# Patient Record
Sex: Female | Born: 1990 | Race: Asian | Hispanic: No | Marital: Single | Smoking: Never smoker
Health system: Southern US, Community
[De-identification: ages and names within clinical notes are randomized; demographics above are authoritative.]

---

## 2012-12-06 ENCOUNTER — Encounter (HOSPITAL_COMMUNITY): Payer: Self-pay | Admitting: Emergency Medicine

## 2012-12-06 ENCOUNTER — Other Ambulatory Visit (HOSPITAL_COMMUNITY)
Admission: RE | Admit: 2012-12-06 | Discharge: 2012-12-06 | Disposition: A | Discharge status: Home / Self Care | Payer: Self-pay | Source: Ambulatory Visit | Attending: Family Medicine | Admitting: Family Medicine

## 2012-12-06 ENCOUNTER — Emergency Department (INDEPENDENT_AMBULATORY_CARE_PROVIDER_SITE_OTHER)
Admission: EM | Admit: 2012-12-06 | Discharge: 2012-12-06 | Disposition: A | Discharge status: Home / Self Care | Payer: Self-pay | Source: Home / Self Care | Attending: Family Medicine | Admitting: Family Medicine

## 2012-12-06 DIAGNOSIS — N760 Acute vaginitis: Secondary | ICD-10-CM | POA: Insufficient documentation

## 2012-12-06 DIAGNOSIS — Z113 Encounter for screening for infections with a predominantly sexual mode of transmission: Secondary | ICD-10-CM | POA: Insufficient documentation

## 2012-12-06 DIAGNOSIS — R1031 Right lower quadrant pain: Secondary | ICD-10-CM

## 2012-12-06 LAB — POCT PREGNANCY, URINE: Preg Test, Ur: NEGATIVE

## 2012-12-06 MED ORDER — PROMETHAZINE HCL 25 MG PO TABS: 25.0000 mg | ORAL_TABLET | Freq: Three times a day (TID) | ORAL | Status: DC | PRN

## 2012-12-06 MED ORDER — NAPROXEN 500 MG PO TABS: 500.0000 mg | ORAL_TABLET | Freq: Two times a day (BID) | ORAL | Status: DC

## 2012-12-06 MED ORDER — NORGESTIM-ETH ESTRAD TRIPHASIC 0.18/0.215/0.25 MG-25 MCG PO TABS: 1.0000 | ORAL_TABLET | Freq: Every day | ORAL | Status: DC

## 2012-12-06 NOTE — ED Notes (Signed)
Patient instructed to undress, place on gown for exam.  Pelvic equipment at bedside

## 2012-12-06 NOTE — ED Notes (Signed)
[phone] is phone number given by patient for contacting her

## 2012-12-06 NOTE — ED Provider Notes (Signed)
History     CSN: [id number]  Arrival date & time 12/06/12  1113   First MD Initiated Contact with Patient 12/06/12 1133      Chief Complaint  Patient presents with  . Abdominal Pain    (Consider location/radiation/quality/duration/timing/severity/associated sxs/prior treatment) HPI Comments: 22 year old nonsmoker female G0 P0. Here complaining of right lower quadrant pain intermittent for about one month. Patient states the pain is not severe but mild to moderate. Has not intermittently for about one month. Denies associated burning on urination or hematuria. No back pain. Denies vaginal discharge. She is just at the end of her menstrual period currently. Denies irregular menstrual periods. No changes on her bowel movements. Denies constipation. She does have loose stools intermittently sporadically. Denies pain with intercourse. No fever or chills. She has had intermittent nausea but no vomiting. No rash. Patient has been sexually active for one year and does not use contraception and admits to inconsistent condom use. She has had a steady partner for one year. No prior history of sexually transmitted diseases. No personal family history of ovarian cancer.   History reviewed. No pertinent past medical history.  History reviewed. No pertinent past surgical history.  No family history on file.  History  Substance Use Topics  . Smoking status: Never Smoker   . Smokeless tobacco: Not on file  . Alcohol Use: Yes    OB History   Grav Para Term Preterm Abortions TAB SAB Ect Mult Living                  Review of Systems  Constitutional: Negative for fever, chills, diaphoresis, appetite change and fatigue.  Gastrointestinal: Positive for abdominal pain. Negative for vomiting and constipation.  Genitourinary: Negative for dysuria, frequency, hematuria, flank pain, vaginal bleeding, vaginal discharge, menstrual problem and dyspareunia.  Skin: Negative for rash.  Neurological: Negative  for dizziness and headaches.  All other systems reviewed and are negative.    Allergies  Sulfa antibiotics  Home Medications   Current Outpatient Rx  Name  Route  Sig  Dispense  Refill  . naproxen (NAPROSYN) 500 MG tablet   Oral   Take 1 tablet (500 mg total) by mouth 2 (two) times daily.   30 tablet   0   . Norgestimate-Ethinyl Estradiol Triphasic 0.18/0.215/0.25 MG-25 MCG tab   Oral   Take 1 tablet by mouth daily.   1 Package   3   . promethazine (PHENERGAN) 25 MG tablet   Oral   Take 1 tablet (25 mg total) by mouth every 8 (eight) hours as needed for nausea.   15 tablet   0     BP 115/74  Pulse 78  Temp(Src) 98.1 F (36.7 C) (Oral)  Resp 18  SpO2 100%  LMP 11/29/2012  Physical Exam  Nursing note and vitals reviewed. Constitutional: She is oriented to person, place, and time. She appears well-developed and well-nourished. No distress.  HENT:  Head: Normocephalic and atraumatic.  Eyes: No scleral icterus.  Cardiovascular: Normal heart sounds.   Pulmonary/Chest: Breath sounds normal.  Abdominal: Soft. Bowel sounds are normal. She exhibits no distension and no mass. There is no tenderness. There is no rebound and no guarding. Hernia confirmed negative in the right inguinal area and confirmed negative in the left inguinal area.  Right lower quadrant tenderness reported with deep palpation.  Genitourinary: Uterus normal.    There is no rash on the right labia. There is no rash on the left labia. Cervix exhibits  no motion tenderness, no discharge and no friability. Right adnexum displays tenderness and fullness. Right adnexum displays no mass. Left adnexum displays no mass, no tenderness and no fullness. Vaginal discharge found.  Bimanual exam: No tenderness to cervical motion. No tender uterus impress normal size. Impress right adnexa palpable and mildly tender. No palpable mass.  Lymphadenopathy:    She has no cervical adenopathy.       Right: No inguinal  adenopathy present.       Left: No inguinal adenopathy present.  Neurological: She is alert and oriented to person, place, and time.  Skin: No rash noted. She is not diaphoretic.    ED Course  Procedures (including critical care time)  Labs Reviewed  POCT PREGNANCY, URINE  CERVICOVAGINAL ANCILLARY ONLY  CERVICOVAGINAL ANCILLARY ONLY   No results found.   1. RLQ abdominal pain       MDM  22 year old female G0 P0 here complaining of right lower quadrant pain intermittently for about a month. Negative pregnancy test. Impress right ovary palpable. Otherwise normal pelvic/speculum exam with no tenderness to cervical motion. Impress possible ovarian functional cyst versus other ovarian pathology. Prescribed naproxen, promethazine and gave a prescription for birth control pills as per patient request. Affirm, GC and Chlamydia test pending at the time of discharge. Patient has an order for a palpation pelvic ultrasound. Supportive care and red flags that should prompt her return to medical attention discussed with patient and provided in writing.        Sharin Grave, MD 12/06/12 1309

## 2012-12-06 NOTE — ED Notes (Signed)
Reports lower right abdomen swelling, intermittent pain ( not excrutiating, just ache) denies urinary symptoms, denies abnormal vaginal discharge, reports bm's have been like usual, but they are formed sometimes and diarrhea sometimes and refers to this as normal for her.  Noticed puffy, aching area for one week

## 2012-12-08 LAB — POCT URINALYSIS DIP (DEVICE)
Ketones, ur: NEGATIVE mg/dL
Protein, ur: NEGATIVE mg/dL
Specific Gravity, Urine: 1.015 (ref 1.005–1.030)

## 2012-12-16 ENCOUNTER — Ambulatory Visit (HOSPITAL_COMMUNITY)
Admission: RE | Admit: 2012-12-16 | Discharge: 2012-12-16 | Disposition: A | Discharge status: Home / Self Care | Payer: Self-pay | Source: Ambulatory Visit | Attending: Family Medicine | Admitting: Family Medicine

## 2012-12-16 ENCOUNTER — Other Ambulatory Visit (HOSPITAL_COMMUNITY): Payer: Self-pay | Admitting: Family Medicine

## 2012-12-16 DIAGNOSIS — R1900 Intra-abdominal and pelvic swelling, mass and lump, unspecified site: Secondary | ICD-10-CM | POA: Insufficient documentation

## 2012-12-16 DIAGNOSIS — N83209 Unspecified ovarian cyst, unspecified side: Secondary | ICD-10-CM | POA: Insufficient documentation

## 2012-12-16 DIAGNOSIS — R1031 Right lower quadrant pain: Secondary | ICD-10-CM

## 2012-12-16 DIAGNOSIS — N949 Unspecified condition associated with female genital organs and menstrual cycle: Secondary | ICD-10-CM | POA: Insufficient documentation

## 2012-12-20 NOTE — ED Notes (Signed)
PT  CALLED   INQUIRING  ULTRASOUND  RESULTS      -  NOTIFIED  NORMAL   -  PT  WAS  ADVISED  TO  FOLLOWUP AS  DIRECTED  ON HER LAST  VISIT

## 2012-12-30 ENCOUNTER — Encounter (HOSPITAL_COMMUNITY): Payer: Self-pay | Admitting: Advanced Practice Midwife

## 2012-12-30 ENCOUNTER — Inpatient Hospital Stay (HOSPITAL_COMMUNITY)
Admission: AD | Admit: 2012-12-30 | Discharge: 2012-12-30 | Disposition: A | Discharge status: Home / Self Care | Payer: Self-pay | Source: Ambulatory Visit | Attending: Obstetrics & Gynecology | Admitting: Obstetrics & Gynecology

## 2012-12-30 DIAGNOSIS — R143 Flatulence: Secondary | ICD-10-CM

## 2012-12-30 DIAGNOSIS — R109 Unspecified abdominal pain: Secondary | ICD-10-CM | POA: Insufficient documentation

## 2012-12-30 DIAGNOSIS — R140 Abdominal distension (gaseous): Secondary | ICD-10-CM

## 2012-12-30 DIAGNOSIS — R141 Gas pain: Secondary | ICD-10-CM | POA: Insufficient documentation

## 2012-12-30 DIAGNOSIS — N83209 Unspecified ovarian cyst, unspecified side: Secondary | ICD-10-CM | POA: Insufficient documentation

## 2012-12-30 DIAGNOSIS — R142 Eructation: Secondary | ICD-10-CM | POA: Insufficient documentation

## 2012-12-30 DIAGNOSIS — N949 Unspecified condition associated with female genital organs and menstrual cycle: Secondary | ICD-10-CM | POA: Insufficient documentation

## 2012-12-30 LAB — POCT PREGNANCY, URINE: Preg Test, Ur: NEGATIVE

## 2012-12-30 LAB — URINALYSIS, ROUTINE W REFLEX MICROSCOPIC
Glucose, UA: NEGATIVE mg/dL
Ketones, ur: NEGATIVE mg/dL
Leukocytes, UA: NEGATIVE
pH: 7.5 (ref 5.0–8.0)

## 2012-12-30 LAB — CBC
MCH: 28.9 pg (ref 26.0–34.0)
MCHC: 32.9 g/dL (ref 30.0–36.0)
Platelets: 216 10*3/uL (ref 150–400)
RDW: 12.6 % (ref 11.5–15.5)

## 2012-12-30 LAB — URINE MICROSCOPIC-ADD ON

## 2012-12-30 NOTE — MAU Note (Signed)
C/o intermittent pelvic pain and swelling for 2 months; diagnosed with ovarian cyst 2 weeks ago;

## 2012-12-30 NOTE — MAU Provider Note (Signed)
Chief Complaint: Pelvic Pain  First Provider Initiated Contact with Patient 12/30/12 2133     SUBJECTIVE HPI: Amy Soto is a 22 y.o. G0 nonpregnant female who presents with low abdominal bloating and mild intermittent low abdominal pain x2 months that she rates 3/10 on pain scale at worst. None now. Ultrasound at urgent care on 12/16/2012 showed functional ovarian cyst. Patient is here to followup on cyst diagnosis. Denies fever, chills, nausea, vomiting, diarrhea, constipation, urinary complaints. LMP today. Possible relationship between certain foods and exacerbation of symptoms.   History reviewed. No pertinent past medical history. OB History   Grav Para Term Preterm Abortions TAB SAB Ect Mult Living                 History reviewed. No pertinent past surgical history. History   Social History  . Marital Status: Single    Spouse Name: N/A    Number of Children: N/A  . Years of Education: N/A   Occupational History  . Not on file.   Social History Main Topics  . Smoking status: Never Smoker   . Smokeless tobacco: Not on file  . Alcohol Use: Yes  . Drug Use: No  . Sexually Active: Yes    Birth Control/ Protection: None   Other Topics Concern  . Not on file   Social History Narrative  . No narrative on file   No current facility-administered medications on file prior to encounter.   No current outpatient prescriptions on file prior to encounter.   Allergies  Allergen Reactions  . Sulfa Antibiotics Hives and Itching    ROS: Pertinent items in HPI  OBJECTIVE Blood pressure 131/84, pulse 102, temperature 98.3 F (36.8 C), temperature source Oral, resp. rate 16, height 5' (1.524 m), weight 47.174 kg (104 lb), last menstrual period 11/26/2012. GENERAL: Well-developed, well-nourished female in no acute distress.  HEENT: Normocephalic HEART: normal rate RESP: normal effort ABDOMEN: Soft, slight fullness above symphysis pubis with patient in standing position. Mild  diffuse bilateral low abdominal tenderness to palpation. Negative rebound, mass or guarding. Positive bowel sounds x4. EXTREMITIES: Nontender, no edema NEURO: Alert and oriented SPECULUM EXAM: NEFG, physiologic discharge, small to moderate amount of dark red blood noted in vault consistent with menstrual bleeding, cervix clean BIMANUAL: cervix closed; uterus normal size, no adnexal tenderness or masses. No CMT.  LAB RESULTS Results for orders placed during the hospital encounter of 12/30/12 (from the past 24 hour(s))  URINALYSIS, ROUTINE W REFLEX MICROSCOPIC     Status: Abnormal   Collection Time    12/30/12  6:40 PM      Result Value Range   Color, Urine YELLOW  YELLOW   APPearance CLEAR  CLEAR   Specific Gravity, Urine 1.010  1.005 - 1.030   pH 7.5  5.0 - 8.0   Glucose, UA NEGATIVE  NEGATIVE mg/dL   Hgb urine dipstick SMALL (*) NEGATIVE   Bilirubin Urine NEGATIVE  NEGATIVE   Ketones, ur NEGATIVE  NEGATIVE mg/dL   Protein, ur NEGATIVE  NEGATIVE mg/dL   Urobilinogen, UA 0.2  0.0 - 1.0 mg/dL   Nitrite NEGATIVE  NEGATIVE   Leukocytes, UA NEGATIVE  NEGATIVE  URINE MICROSCOPIC-ADD ON     Status: None   Collection Time    12/30/12  6:40 PM      Result Value Range   Squamous Epithelial / LPF RARE  RARE   WBC, UA 0-2  <3 WBC/hpf   RBC / HPF 0-2  <3 RBC/hpf  Bacteria, UA RARE  RARE  POCT PREGNANCY, URINE     Status: None   Collection Time    12/30/12  6:50 PM      Result Value Range   Preg Test, Ur NEGATIVE  NEGATIVE  CBC     Status: None   Collection Time    12/30/12  9:40 PM      Result Value Range   WBC 7.8  4.0 - 10.5 K/uL   RBC 4.54  3.87 - 5.11 MIL/uL   Hemoglobin 13.1  12.0 - 15.0 g/dL   HCT 88.4  16.6 - 06.3 %   MCV 87.7  78.0 - 100.0 fL   MCH 28.9  26.0 - 34.0 pg   MCHC 32.9  30.0 - 36.0 g/dL   RDW 01.6  01.0 - 93.2 %   Platelets 216  150 - 400 K/uL  WET PREP, GENITAL     Status: Abnormal   Collection Time    12/30/12 10:40 PM      Result Value Range   Yeast  Wet Prep HPF POC NONE SEEN  NONE SEEN   Trich, Wet Prep NONE SEEN  NONE SEEN   Clue Cells Wet Prep HPF POC NONE SEEN  NONE SEEN   WBC, Wet Prep HPF POC FEW (*) NONE SEEN    IMAGING 5/13/ 014: Right ovary: The right ovary measures 3.6 x 2.8 x 2.5 cm. A 1.8 x  1.5 x 1.7 cm cyst / follicle is noted. Vascular flow within the  right ovary is noted.  MAU COURSE  ASSESSMENT 1. Abdominal bloating   2.  1.8 cm right functional ovarian cyst  PLAN Discharge home in stable condition. No scheduled followup of ovarian cysts needed for radiology.  Keep food diary to help identify dietary causes bloating and pain. Consider trying one week trial of no dairy. GC Chlamydia cultures pending     Follow-up Information   Follow up with Primary care provider. (As needed if symptoms worsen)        Medication List    TAKE these medications       naproxen 500 MG tablet  Commonly known as:  NAPROSYN  Take 500 mg by mouth 2 (two) times daily as needed (for pain).     VISINE OP  Apply 2 drops to eye daily as needed (for dry eyes).        [place], CNM 12/30/2012  11:30 PM

## 2012-12-30 NOTE — MAU Note (Signed)
Checking up on her stomach, has been to urgent care and [place]. ? Cyst.  Was told to return if pain continues.  Is still feeling bloated, still having pain that comes and goes.  Has also been having a cough, when she coughs her stomach pulls/ contracts and it hurts.

## 2012-12-31 LAB — GC/CHLAMYDIA PROBE AMP: CT Probe RNA: NEGATIVE

## 2013-12-21 IMAGING — US US TRANSVAGINAL NON-OB
1 series · 14 of 25 positions shown · non-contrast
Comparison: None

CLINICAL DATA: 21-year-old female with pelvic pain and swelling.



[Series 1: us transvaginal non-ob · 0.28mm/px · 63 acquisitions, 14 frames shown]
[im 1/63]
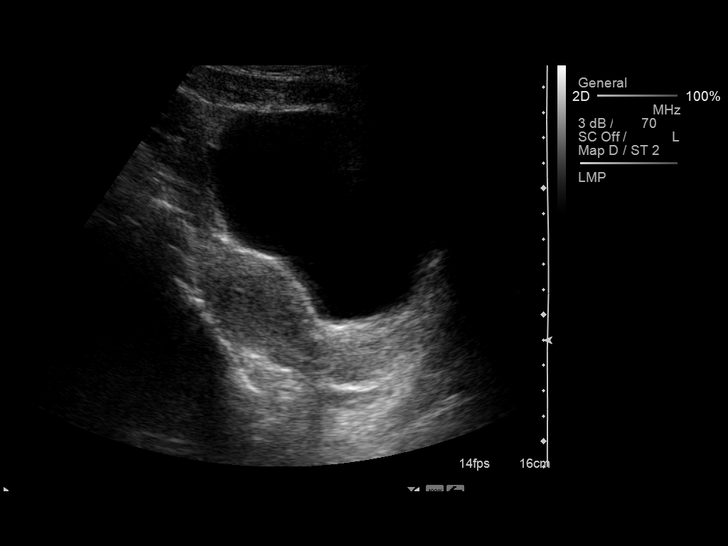
[im 6/63]
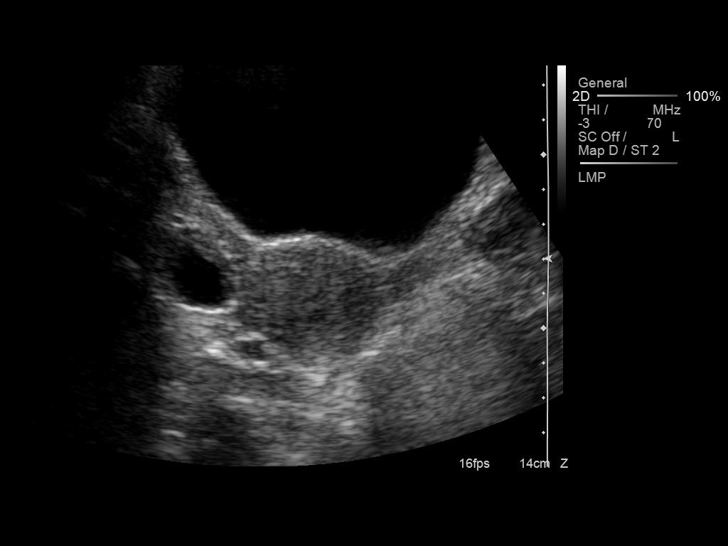
[im 11/63]
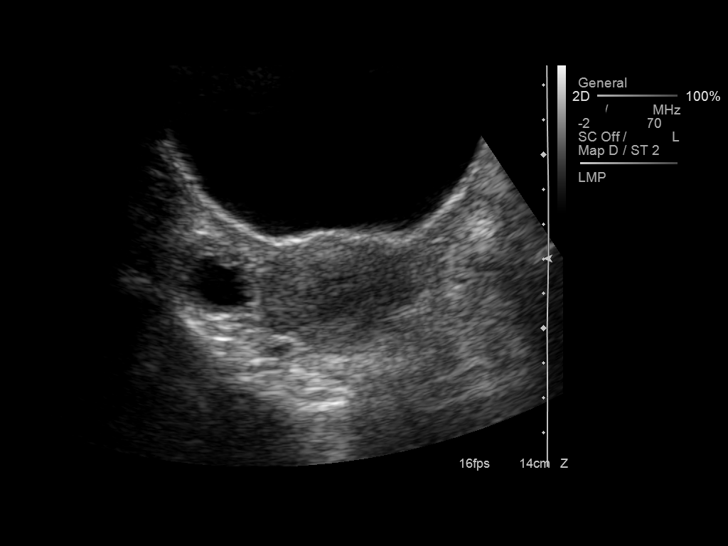
[im 16/63]
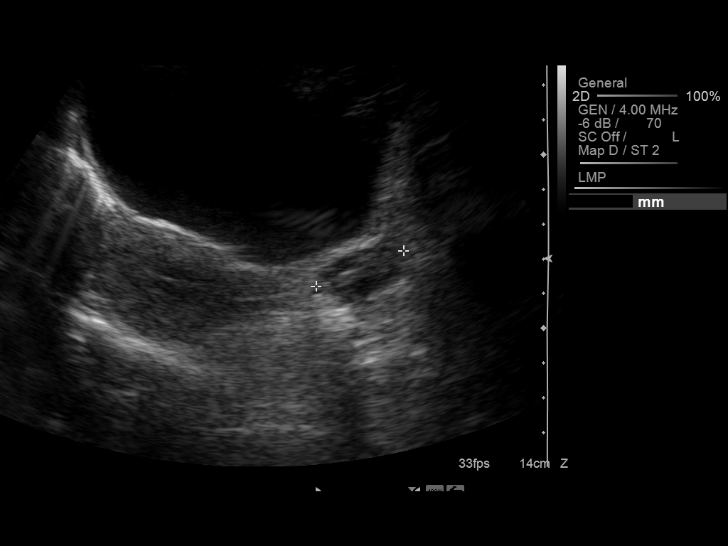
[im 21/63]
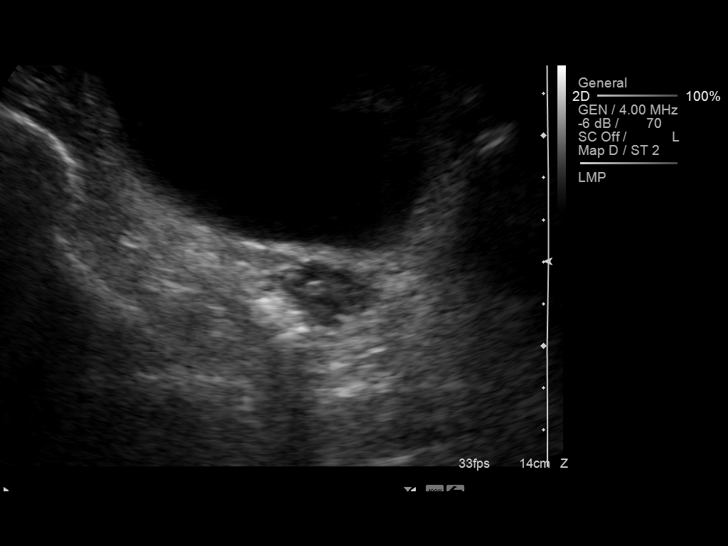
[im 24/63]
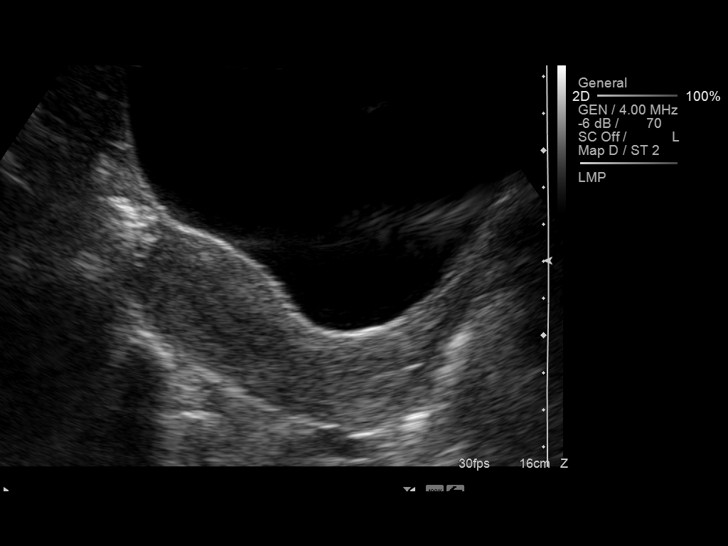
[im 29/63]
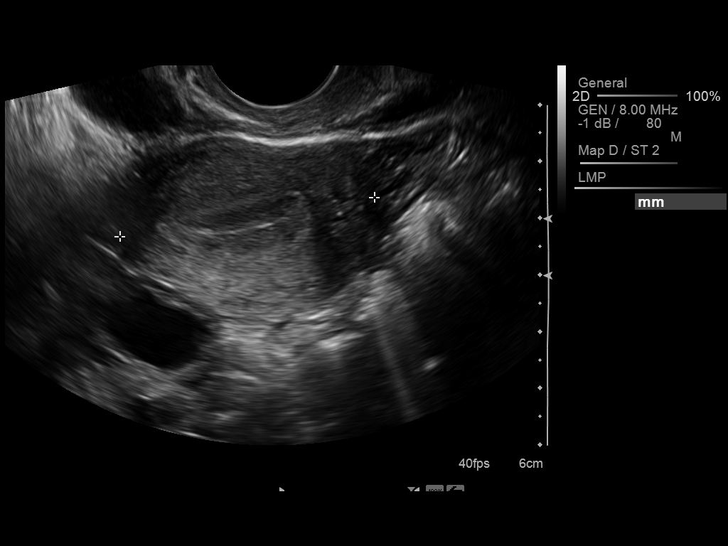
[im 34/63]
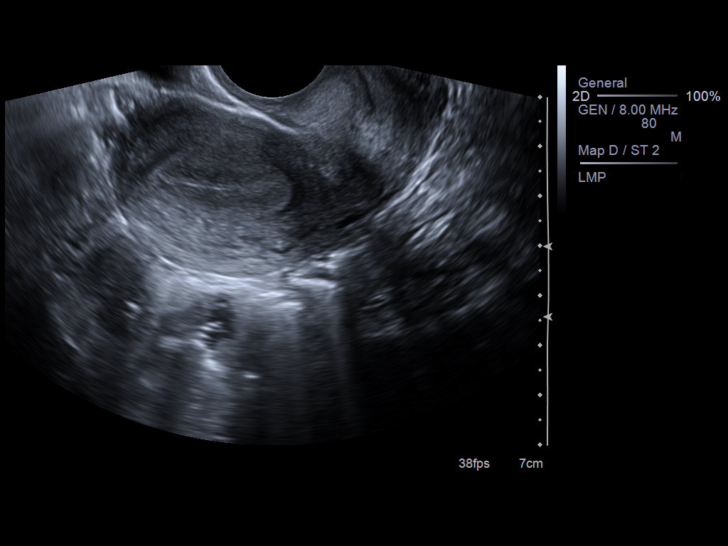
[im 39/63]
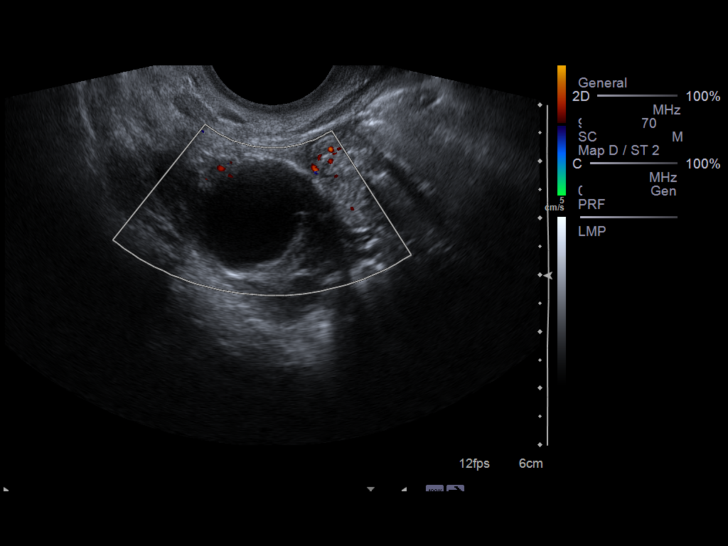
[im 42/63]
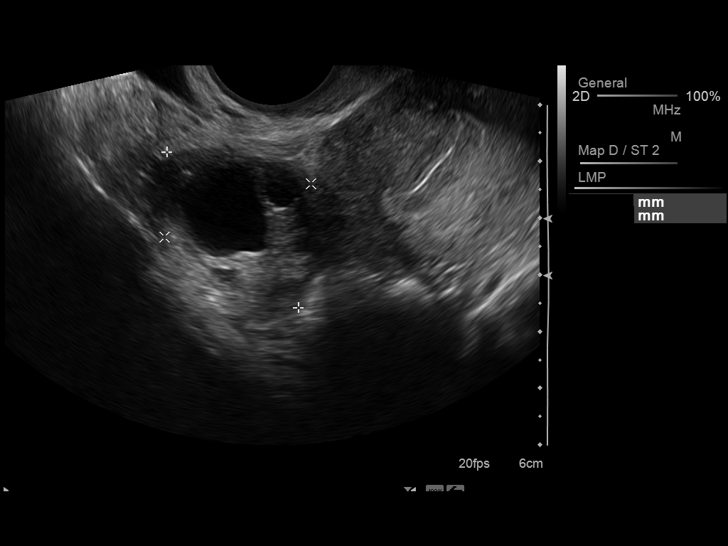
[im 47/63]
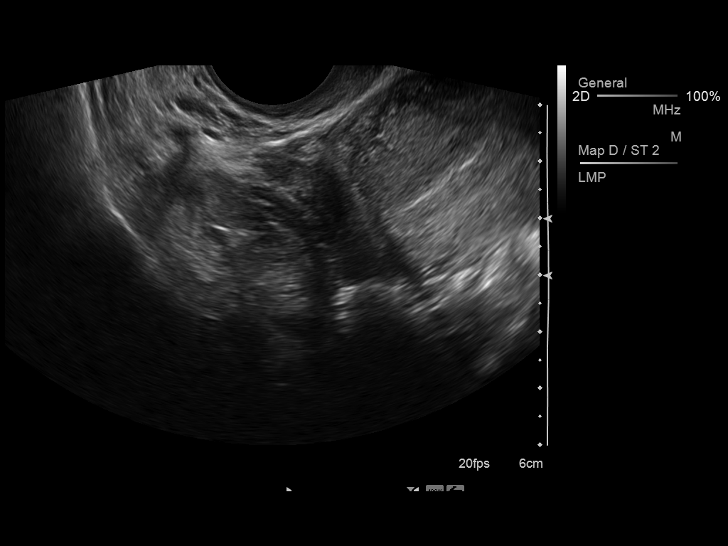
[im 52/63]
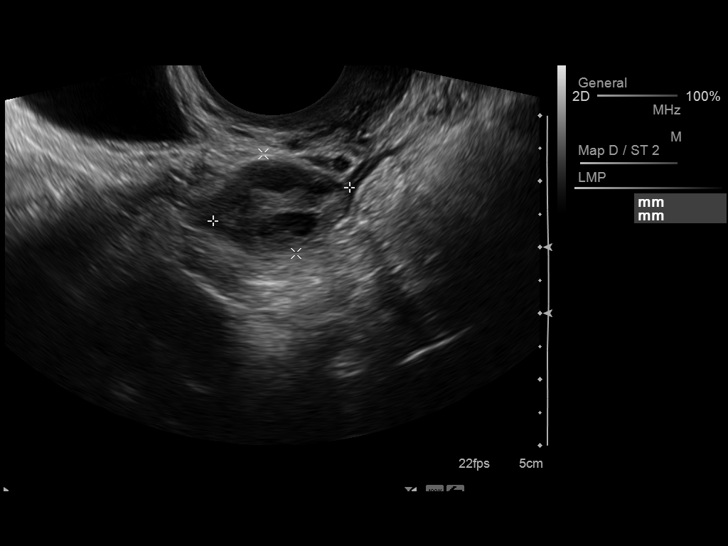
[im 57/63]
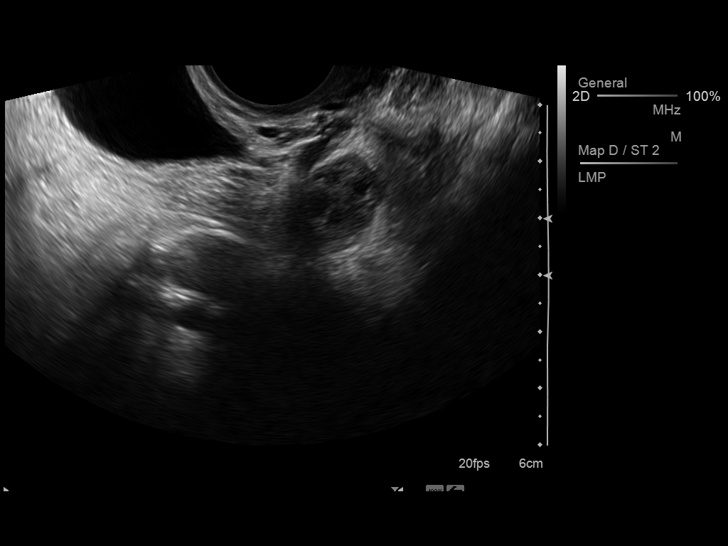
[im 63/63]
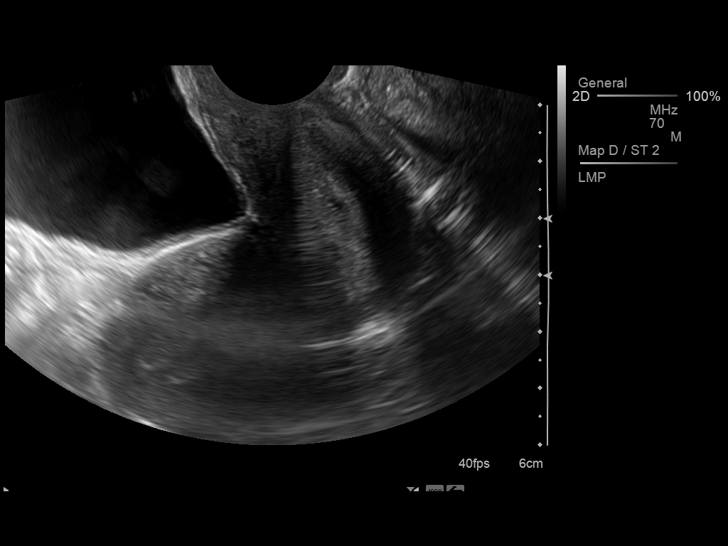

[14 of 25 positions shown; findings below may reference images not displayed]

FINDINGS: Uterus: The uterus is anteverted measuring 7.3 x 3.3 x 4.5 cm.  No
focal uterine masses are identified.

Endometrium: The endometrium is unremarkable and trilaminar
measuring 6.5 mm in thickness.

Right ovary:  The right ovary measures 3.6 x 2.8 x 2.5 cm.  A 1.8 x
1.5 x 1.7 cm cyst / follicle is noted. Vascular flow within the
right ovary is noted.

Left ovary: The left ovary is unremarkable measuring 2.1 x 1.6 x
2.3 cm. Vascular flow within the left ovary is noted.

Other findings: There is no evidence of free fluid or solid adnexal
mass.
IMPRESSION: Normal study. No evidence of pelvic mass or other significant
abnormality.

## 2015-02-15 ENCOUNTER — Ambulatory Visit (INDEPENDENT_AMBULATORY_CARE_PROVIDER_SITE_OTHER): Payer: 59 | Admitting: Family Medicine

## 2015-02-15 VITALS — BP 104/74 | HR 90 | Temp 98.4°F | Resp 14 | Ht 61.0 in | Wt 107.0 lb

## 2015-02-15 DIAGNOSIS — T148 Other injury of unspecified body region: Secondary | ICD-10-CM | POA: Diagnosis not present

## 2015-02-15 DIAGNOSIS — W57XXXA Bitten or stung by nonvenomous insect and other nonvenomous arthropods, initial encounter: Secondary | ICD-10-CM | POA: Diagnosis not present

## 2015-02-15 DIAGNOSIS — R21 Rash and other nonspecific skin eruption: Secondary | ICD-10-CM | POA: Diagnosis not present

## 2015-02-15 LAB — COMPREHENSIVE METABOLIC PANEL
ALBUMIN: 4.2 g/dL (ref 3.5–5.2)
ALT: 8 U/L (ref 0–35)
AST: 13 U/L (ref 0–37)
Alkaline Phosphatase: 36 U/L — ABNORMAL LOW (ref 39–117)
BILIRUBIN TOTAL: 1 mg/dL (ref 0.2–1.2)
BUN: 9 mg/dL (ref 6–23)
CALCIUM: 9 mg/dL (ref 8.4–10.5)
CO2: 24 mEq/L (ref 19–32)
Chloride: 106 mEq/L (ref 96–112)
Creat: 0.53 mg/dL (ref 0.50–1.10)
Glucose, Bld: 95 mg/dL (ref 70–99)
Potassium: 4 mEq/L (ref 3.5–5.3)
Sodium: 139 mEq/L (ref 135–145)
TOTAL PROTEIN: 7 g/dL (ref 6.0–8.3)

## 2015-02-15 LAB — CBC
HEMATOCRIT: 43.2 % (ref 36.0–46.0)
HEMOGLOBIN: 14.2 g/dL (ref 12.0–15.0)
MCH: 28.6 pg (ref 26.0–34.0)
MCHC: 32.9 g/dL (ref 30.0–36.0)
MCV: 87.1 fL (ref 78.0–100.0)
MPV: 9.7 fL (ref 8.6–12.4)
PLATELETS: 233 10*3/uL (ref 150–400)
RBC: 4.96 MIL/uL (ref 3.87–5.11)
RDW: 13 % (ref 11.5–15.5)
WBC: 6.7 10*3/uL (ref 4.0–10.5)

## 2015-02-15 MED ORDER — HYDROXYZINE HCL 25 MG PO TABS: 12.5000 mg | ORAL_TABLET | Freq: Three times a day (TID) | ORAL | Status: DC | PRN

## 2015-02-15 MED ORDER — PREDNISONE 20 MG PO TABS: ORAL_TABLET | ORAL | Status: DC

## 2015-02-15 NOTE — Progress Notes (Signed)
   Subjective:    Patient ID: Amy Soto, female    DOB: [birth year], 24 y.o.   MRN: [id number]  HPI Patient presents for bug bites that have been increasingly itchy. Notices the first bites 4 mornings ago upon her return from [place]. She stayed in a hotel during her time away. Since she has been home has had several new crops of bites. Areas become swollen and feel like they are burning. Denies fever, wheezing, and SOB. Med allergy to Sulfa antibx.    Review of Systems  Constitutional: Negative for fever, chills and fatigue.  Skin: Positive for color change. Negative for rash and wound.       Objective:   Physical Exam  Constitutional: She is oriented to person, place, and time. She appears well-developed and well-nourished. No distress.  Blood pressure 104/74, pulse 90, temperature 98.4 F (36.9 C), temperature source Oral, resp. rate 14, height 5\' 1"  (1.549 m), weight 107 lb (48.535 kg), last menstrual period 01/07/2015, SpO2 98 %.   HENT:  Head: Normocephalic and atraumatic.  Right Ear: External ear normal.  Left Ear: External ear normal.  Eyes: Conjunctivae are normal. Right eye exhibits no discharge. Left eye exhibits no discharge. No scleral icterus.  Cardiovascular: Normal rate, regular rhythm and normal heart sounds.  Exam reveals no gallop and no friction rub.   No murmur heard. Pulmonary/Chest: Effort normal and breath sounds normal. No respiratory distress. She has no wheezes. She has no rales.  Neurological: She is alert and oriented to person, place, and time.  Skin: Skin is warm and dry. She is not diaphoretic. There is erythema (swollen bug bites with centrally located bite. ). No pallor.  Psychiatric: She has a normal mood and affect. Her behavior is normal. Judgment and thought content normal.       Assessment & Plan:  1. Rash/skin eruption 2. Bug bites Bites possibly bed bugs. Should wash linens and all clothes that went on trip in hot water and dry. May need  exterminator if bites continue. - CBC - Comprehensive metabolic panel - predniSONE (DELTASONE) 20 MG tablet; Take 3 PO QAM x3days, 2 PO QAM x3days, 1 PO QAM x3days  Dispense: 18 tablet; Refill: 0 - hydrOXYzine (ATARAX/VISTARIL) 25 MG tablet; Take 0.5-1 tablets (12.5-25 mg total) by mouth every 8 (eight) hours as needed for itching.  Dispense: 30 tablet; Refill: 0     Amy Vanderlinden PA-C  [facility] 02/15/2015 4:47 PM

## 2015-02-15 NOTE — Patient Instructions (Signed)
All recently used clothing, towels, stuffed toys, and bed linens should be washed in hot water and then dried in a dryer for at least 20 minutes on high heat. Items that cannot be washed should be enclosed in a plastic bag for at least 3 days.  To help relieve itching, bathe in a cool bath or apply cool washcloths to the affected areas.

## 2015-02-16 ENCOUNTER — Encounter: Payer: Self-pay | Admitting: Physician Assistant

## 2017-10-16 ENCOUNTER — Ambulatory Visit: Payer: Self-pay | Admitting: Physician Assistant

## 2017-10-16 ENCOUNTER — Encounter: Payer: Self-pay | Admitting: Physician Assistant

## 2017-10-16 VITALS — BP 111/76 | HR 77 | Temp 97.6°F | Resp 16 | Ht 61.0 in | Wt 94.0 lb

## 2017-10-16 DIAGNOSIS — H6983 Other specified disorders of Eustachian tube, bilateral: Secondary | ICD-10-CM

## 2017-10-16 MED ORDER — IPRATROPIUM BROMIDE 0.03 % NA SOLN: 2.0000 | Freq: Two times a day (BID) | NASAL | 0 refills | Status: AC

## 2017-10-16 MED ORDER — PREDNISONE 20 MG PO TABS: 40.0000 mg | ORAL_TABLET | Freq: Every day | ORAL | 0 refills | Status: AC

## 2017-10-16 NOTE — Patient Instructions (Addendum)
Please make sure that you are hydrating well with 64 oz of water if not more. If you have no improvement of your symptoms within 72 hours, please contact us by phone.  Of course if you develop difficulty breathing, or shortness of breath and below listed symptoms, you need to come back.   Upper Respiratory Infection, Adult Most upper respiratory infections (URIs) are caused by a virus. A URI affects the nose, throat, and upper air passages. The most common type of URI is often called "the common cold." Follow these instructions at home:  Take medicines only as told by your doctor.  Gargle warm saltwater or take cough drops to comfort your throat as told by your doctor.  Use a warm mist humidifier or inhale steam from a shower to increase air moisture. This may make it easier to breathe.  Drink enough fluid to keep your pee (urine) clear or pale yellow.  Eat soups and other clear broths.  Have a healthy diet.  Rest as needed.  Go back to work when your fever is gone or your doctor says it is okay. ? You may need to stay home longer to avoid giving your URI to others. ? You can also wear a face mask and wash your hands often to prevent spread of the virus.  Use your inhaler more if you have asthma.  Do not use any tobacco products, including cigarettes, chewing tobacco, or electronic cigarettes. If you need help quitting, ask your doctor. Contact a doctor if:  You are getting worse, not better.  Your symptoms are not helped by medicine.  You have chills.  You are getting more short of breath.  You have brown or red mucus.  You have yellow or brown discharge from your nose.  You have pain in your face, especially when you bend forward.  You have a fever.  You have puffy (swollen) neck glands.  You have pain while swallowing.  You have white areas in the back of your throat. Get help right away if:  You have very bad or constant: ? Headache. ? Ear pain. ? Pain in  your forehead, behind your eyes, and over your cheekbones (sinus pain). ? Chest pain.  You have long-lasting (chronic) lung disease and any of the following: ? Wheezing. ? Long-lasting cough. ? Coughing up blood. ? A change in your usual mucus.  You have a stiff neck.  You have changes in your: ? Vision. ? Hearing. ? Thinking. ? Mood. This information is not intended to replace advice given to you by your health care provider. Make sure you discuss any questions you have with your health care provider. Document Released: 01/09/2008 Document Revised: 03/25/2016 Document Reviewed: 10/28/2013 Elsevier Interactive Patient Education  2018 [employer]

## 2017-10-16 NOTE — Progress Notes (Signed)
[facility] [address], [place] [phone]  Date:  10/16/2017   Name:  Amy Soto   DOB:  [birth year]   MRN:  [id number]  PCP:  System, Pcp Not In    History of Present Illness:  Amy Soto is a 27 y.o. female patient who presents to PCP with  Chief Complaint  Patient presents with  . ear problem    states she believes she has an ear infection; denies pain but says they pop a lot     Patient reports a popping sound in his ear.  It is not painful but it is continuous.  She does have nasal congestion.  States that he also has runny nose.  No sore throat.  No cough.  No fever.  She has done nothing for his symptoms.  She has had some malaise.   There are no active problems to display for this patient.   History reviewed. No pertinent past medical history.  History reviewed. No pertinent surgical history.  Social History   Tobacco Use  . Smoking status: Never Smoker  . Smokeless tobacco: Never Used  Substance Use Topics  . Alcohol use: Yes  . Drug use: No    History reviewed. No pertinent family history.  Allergies  Allergen Reactions  . Sulfa Antibiotics Hives and Itching    Medication list has been reviewed and updated.  No current outpatient medications on file prior to visit.   No current facility-administered medications on file prior to visit.     ROS ROS otherwise unremarkable unless listed above.  Physical Examination: BP 111/76   Pulse 77   Temp 97.6 F (36.4 C) (Oral)   Resp 16   Ht 5\' 1"  (1.549 m)   Wt 94 lb (42.6 kg)   SpO2 97%   BMI 17.76 kg/m  Ideal Body Weight: Weight in (lb) to have BMI = 25: 132  Physical Exam  Constitutional: She is oriented to person, place, and time. She appears well-developed and well-nourished. No distress.  HENT:  Head: Normocephalic and atraumatic.  Right Ear: Tympanic membrane, external ear and ear canal normal.  Left Ear: Tympanic membrane, external ear and ear canal normal.   Nose: Mucosal edema and rhinorrhea present. Right sinus exhibits no maxillary sinus tenderness and no frontal sinus tenderness. Left sinus exhibits no maxillary sinus tenderness and no frontal sinus tenderness.  Mouth/Throat: No uvula swelling. No oropharyngeal exudate, posterior oropharyngeal edema or posterior oropharyngeal erythema.  Eyes: Pupils are equal, round, and reactive to light. Conjunctivae and EOM are normal.  Cardiovascular: Normal rate and regular rhythm. Exam reveals no gallop, no distant heart sounds and no friction rub.  No murmur heard. Pulmonary/Chest: Effort normal. No respiratory distress. She has no decreased breath sounds. She has no wheezes. She has no rhonchi.  Lymphadenopathy:       Head (right side): No submandibular, no tonsillar, no preauricular and no posterior auricular adenopathy present.       Head (left side): No submandibular, no tonsillar, no preauricular and no posterior auricular adenopathy present.  Neurological: She is alert and oriented to person, place, and time.  Skin: She is not diaphoretic.  Psychiatric: She has a normal mood and affect. Her behavior is normal.     Assessment and Plan: Amy Soto is a 27 y.o. female who is here today for cc of  Chief Complaint  Patient presents with  . ear problem    states she believes she has an  ear infection; denies pain but says they pop a lot   If symptoms do not improve within 72 hours, will start augmentin bid 5 days.  Appears to be eustachian tube dysfunction.  I will treat initially with a nasal spray to open up the sinus and prednisone short burst.  He will contact if he has no improvement in the next 3 days.  We will extend his symptoms to 10 days. Dysfunction of both eustachian tubes - Plan: ipratropium (ATROVENT) 0.03 % nasal spray, predniSONE (DELTASONE) 20 MG tablet  Amy Platt, PA-C [facility] 3/29/20197:59 AM

## 2017-11-01 ENCOUNTER — Encounter: Payer: Self-pay | Admitting: Physician Assistant

## 2017-11-13 ENCOUNTER — Encounter: Payer: Self-pay | Admitting: Physician Assistant
